# Patient Record
Sex: Male | Born: 1975 | Hispanic: No | Marital: Married | Smoking: Never smoker
Health system: Southern US, Community
[De-identification: ages and names within clinical notes are randomized; demographics above are authoritative.]

---

## 1998-05-19 ENCOUNTER — Ambulatory Visit (HOSPITAL_COMMUNITY): Admission: RE | Admit: 1998-05-19 | Discharge: 1998-05-19 | Payer: Self-pay | Admitting: Orthopedic Surgery

## 1999-05-15 ENCOUNTER — Emergency Department (HOSPITAL_COMMUNITY): Admission: EM | Admit: 1999-05-15 | Discharge: 1999-05-15 | Payer: Self-pay | Admitting: Emergency Medicine

## 1999-12-31 ENCOUNTER — Ambulatory Visit (HOSPITAL_COMMUNITY): Admission: RE | Admit: 1999-12-31 | Discharge: 1999-12-31 | Payer: Self-pay | Admitting: General Surgery

## 2000-09-25 ENCOUNTER — Emergency Department (HOSPITAL_COMMUNITY): Admission: EM | Admit: 2000-09-25 | Discharge: 2000-09-26 | Payer: Self-pay | Admitting: Emergency Medicine

## 2000-09-25 ENCOUNTER — Encounter: Payer: Self-pay | Admitting: Emergency Medicine

## 2000-12-16 ENCOUNTER — Emergency Department (HOSPITAL_COMMUNITY): Admission: EM | Admit: 2000-12-16 | Discharge: 2000-12-16 | Payer: Self-pay | Admitting: Emergency Medicine

## 2000-12-16 ENCOUNTER — Encounter: Payer: Self-pay | Admitting: Emergency Medicine

## 2001-05-24 ENCOUNTER — Encounter: Payer: Self-pay | Admitting: *Deleted

## 2001-05-24 ENCOUNTER — Ambulatory Visit (HOSPITAL_COMMUNITY): Admission: RE | Admit: 2001-05-24 | Discharge: 2001-05-24 | Payer: Self-pay | Admitting: *Deleted

## 2005-01-02 ENCOUNTER — Emergency Department (HOSPITAL_COMMUNITY): Admission: EM | Admit: 2005-01-02 | Discharge: 2005-01-02 | Payer: Self-pay | Admitting: Emergency Medicine

## 2005-02-04 ENCOUNTER — Ambulatory Visit: Payer: Self-pay | Admitting: Internal Medicine

## 2005-10-12 ENCOUNTER — Emergency Department (HOSPITAL_COMMUNITY): Admission: EM | Admit: 2005-10-12 | Discharge: 2005-10-12 | Payer: Self-pay | Admitting: Emergency Medicine
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
## 2020-02-29 ENCOUNTER — Emergency Department (HOSPITAL_COMMUNITY): Payer: Self-pay

## 2020-02-29 ENCOUNTER — Emergency Department (HOSPITAL_COMMUNITY)
Admission: EM | Admit: 2020-02-29 | Discharge: 2020-03-01 | Disposition: A | Discharge status: Home / Self Care | Payer: Self-pay | Attending: Emergency Medicine | Admitting: Emergency Medicine

## 2020-02-29 ENCOUNTER — Encounter (HOSPITAL_COMMUNITY): Payer: Self-pay | Admitting: Emergency Medicine

## 2020-02-29 ENCOUNTER — Other Ambulatory Visit: Payer: Self-pay

## 2020-02-29 DIAGNOSIS — R45851 Suicidal ideations: Secondary | ICD-10-CM | POA: Insufficient documentation

## 2020-02-29 DIAGNOSIS — Z20822 Contact with and (suspected) exposure to covid-19: Secondary | ICD-10-CM | POA: Insufficient documentation

## 2020-02-29 DIAGNOSIS — M79672 Pain in left foot: Secondary | ICD-10-CM | POA: Insufficient documentation

## 2020-02-29 DIAGNOSIS — R443 Hallucinations, unspecified: Secondary | ICD-10-CM

## 2020-02-29 DIAGNOSIS — R441 Visual hallucinations: Secondary | ICD-10-CM | POA: Insufficient documentation

## 2020-02-29 DIAGNOSIS — R440 Auditory hallucinations: Secondary | ICD-10-CM | POA: Insufficient documentation

## 2020-02-29 DIAGNOSIS — F321 Major depressive disorder, single episode, moderate: Secondary | ICD-10-CM | POA: Insufficient documentation

## 2020-02-29 DIAGNOSIS — M79671 Pain in right foot: Secondary | ICD-10-CM | POA: Insufficient documentation

## 2020-02-29 LAB — CBC WITH DIFFERENTIAL/PLATELET
Abs Immature Granulocytes: 0.03 10*3/uL (ref 0.00–0.07)
Basophils Absolute: 0.1 10*3/uL (ref 0.0–0.1)
Basophils Relative: 1 %
Eosinophils Absolute: 0.2 10*3/uL (ref 0.0–0.5)
Eosinophils Relative: 2 %
HCT: 41.6 % (ref 39.0–52.0)
Hemoglobin: 13.8 g/dL (ref 13.0–17.0)
Immature Granulocytes: 0 %
Lymphocytes Relative: 29 %
Lymphs Abs: 3.6 10*3/uL (ref 0.7–4.0)
MCH: 30.7 pg (ref 26.0–34.0)
MCHC: 33.2 g/dL (ref 30.0–36.0)
MCV: 92.4 fL (ref 80.0–100.0)
Monocytes Absolute: 0.9 10*3/uL (ref 0.1–1.0)
Monocytes Relative: 7 %
Neutro Abs: 7.8 10*3/uL — ABNORMAL HIGH (ref 1.7–7.7)
Neutrophils Relative %: 61 %
Platelets: 352 10*3/uL (ref 150–400)
RBC: 4.5 MIL/uL (ref 4.22–5.81)
RDW: 12.2 % (ref 11.5–15.5)
WBC: 12.6 10*3/uL — ABNORMAL HIGH (ref 4.0–10.5)
nRBC: 0 % (ref 0.0–0.2)

## 2020-02-29 LAB — COMPREHENSIVE METABOLIC PANEL
ALT: 18 U/L (ref 0–44)
AST: 41 U/L (ref 15–41)
Albumin: 4.1 g/dL (ref 3.5–5.0)
Alkaline Phosphatase: 59 U/L (ref 38–126)
Anion gap: 9 (ref 5–15)
BUN: 19 mg/dL (ref 6–20)
CO2: 23 mmol/L (ref 22–32)
Calcium: 8.7 mg/dL — ABNORMAL LOW (ref 8.9–10.3)
Chloride: 105 mmol/L (ref 98–111)
Creatinine, Ser: 0.79 mg/dL (ref 0.61–1.24)
GFR, Estimated: >60 mL/min (ref >60)
Glucose, Bld: 107 mg/dL — ABNORMAL HIGH (ref 70–99)
Potassium: 3.8 mmol/L (ref 3.5–5.1)
Sodium: 137 mmol/L (ref 135–145)
Total Bilirubin: 0.7 mg/dL (ref 0.3–1.2)
Total Protein: 6.7 g/dL (ref 6.5–8.1)

## 2020-02-29 LAB — RESP PANEL BY RT-PCR (FLU A&B, COVID) ARPGX2
Influenza A by PCR: NEGATIVE
Influenza B by PCR: NEGATIVE
SARS Coronavirus 2 by RT PCR: NEGATIVE

## 2020-02-29 LAB — ETHANOL: Alcohol, Ethyl (B): <10 mg/dL (ref <10)

## 2020-02-29 MED ORDER — IBUPROFEN 800 MG PO TABS: 800.0000 mg | ORAL_TABLET | Freq: Once | ORAL | Status: DC

## 2020-02-29 NOTE — ED Notes (Signed)
Pt has a knife locked up with security 

## 2020-02-29 NOTE — ED Triage Notes (Signed)
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 States that he is from [place], [place] and came here to work but got fired today from job.

## 2020-02-29 NOTE — ED Provider Notes (Signed)
[facility]   CSN: [id number] Arrival date & time: 02/29/20  2013     History Chief Complaint  Patient presents with  . Suicidal    Francis Lee is a 44 y.o. male presenting to the ED voluntarily for suicidal ideations. Patient states he lives in [place], however is here for work. Per triage note he was fired from his job today. Patient states he has been having anger issues, states people owe him money and they are not paying him. He states he has suicidal ideation that began today with plan to jump from a bridge. He has been kicking things daily for the last few days due to anger and has significant pain to bilateral feet, right worse than left with associated swelling. He also endorses visual hallucinations, states he sees the devil and angels. He also hears auditory hallucinations that are "good" voices. He denies homicidal ideation. His denies any drug or alcohol use. Denies daily medications or medical problems.  The history is provided by the patient.       History reviewed. No pertinent past medical history.  There are no problems to display for this patient.   History reviewed. No pertinent surgical history.     No family history on file.  Social History   Tobacco Use  . Smoking status: Never Smoker  . Smokeless tobacco: Never Used  Substance Use Topics  . Alcohol use: Never  . Drug use: Never    Home Medications Prior to Admission medications   Not on File    Allergies    Patient has no allergy information on record.  Review of Systems   Review of Systems  Musculoskeletal: Positive for arthralgias.  Psychiatric/Behavioral: Positive for hallucinations, self-injury and suicidal ideas.  All other systems reviewed and are negative.   Physical Exam Updated Vital Signs BP 140/73 (BP Location: Right Arm)   Pulse (!) 101   Temp 98 F (36.7 C) (Oral)   Resp 19   SpO2 100%   Physical  Exam Vitals and nursing note reviewed.  Constitutional:      Appearance: He is well-developed. He is not ill-appearing.  HENT:     Head: Normocephalic and atraumatic.  Eyes:     Conjunctiva/sclera: Conjunctivae normal.  Cardiovascular:     Rate and Rhythm: Normal rate.  Pulmonary:     Effort: Pulmonary effort is normal.  Abdominal:     Palpations: Abdomen is soft.  Musculoskeletal:     Comments: No obvious deformity to b/l LE. Generalized TTP to b/l feet and right ankle. No deformity. Mild swelling is noted. Small scattered scabbed wounds without signs of infection to b/l feet. Pt unwilling to range the feet or ankles much 2/t pain. Normal sensation to toes. Intact DP pulses bilaterally.  Skin:    General: Skin is warm.  Neurological:     Mental Status: He is alert.  Psychiatric:        Mood and Affect: Affect normal. Mood is anxious.        Speech: Speech normal.        Behavior: Behavior normal. Behavior is cooperative.        Thought Content: Thought content includes suicidal ideation. Thought content does not include homicidal ideation. Thought content includes suicidal plan.     ED Results / Procedures / Treatments   Labs (all labs ordered are listed, but only abnormal results are displayed) Labs Reviewed  RESP PANEL BY RT-PCR (FLU A&B, COVID)  ARPGX2  COMPREHENSIVE METABOLIC PANEL  ETHANOL  RAPID URINE DRUG SCREEN, HOSP PERFORMED  CBC WITH DIFFERENTIAL/PLATELET    EKG None  Radiology DG Ankle Complete Right  Result Date: 02/29/2020 CLINICAL DATA:  Pain EXAM: RIGHT ANKLE - COMPLETE 3+ VIEW COMPARISON:  None. FINDINGS: There is no evidence of fracture, dislocation, or joint effusion. There is no evidence of arthropathy or other focal bone abnormality. Soft tissues are unremarkable. IMPRESSION: Negative. Electronically Signed   By: Jonna Clark M.D.   On: 02/29/2020 21:53   DG Foot Complete Left  Result Date: 02/29/2020 CLINICAL DATA:  Injury EXAM: LEFT FOOT -  COMPLETE 3+ VIEW COMPARISON:  None. FINDINGS: There is no evidence of fracture or dislocation. There is no evidence of arthropathy or other focal bone abnormality. Soft tissue swelling seen along the dorsum of the foot. IMPRESSION: Negative. Electronically Signed   By: Jonna Clark M.D.   On: 02/29/2020 21:49   DG Foot Complete Right  Result Date: 02/29/2020 CLINICAL DATA:  Pain EXAM: RIGHT FOOT COMPLETE - 3+ VIEW COMPARISON:  None. FINDINGS: There is no evidence of fracture or dislocation. There is no evidence of arthropathy or other focal bone abnormality. Soft tissues are unremarkable. IMPRESSION: Negative. Electronically Signed   By: Jonna Clark M.D.   On: 02/29/2020 21:53    Procedures Procedures (including critical care time)  Medications Ordered in ED Medications  ibuprofen (ADVIL) tablet 800 mg (has no administration in time range)    ED Course  I have reviewed the triage vital signs and the nursing notes.  Pertinent labs & imaging results that were available during my care of the patient were reviewed by me and considered in my medical decision making (see chart for details).    MDM Rules/Calculators/A&P                          Patient presenting to the ED voluntarily for suicidal ideation with plan to jump from a bridge, self-injurious activity with bilateral feet pain, auditory visual hallucinations. Denies drug or alcohol use, denies medical problems or daily medications. On exam, he is cooperative though appears to be somewhat anxious. He endorses pain to bilateral feet, right worse than left, due to kicking items over the last couple of days attributed to anger. On exam, no obvious deformity though does have generalized tenderness to the feet, right ankle is also tender. Neurovascularly intact. Old appearing wounds to the feet without signs of infection. Plain films ordered, ibuprofen for pain.  Screening labs are pending.  xrays independently reviewed and interpreted by  me; xrays are negative for acute fracture.   Care assumed at shift change by PA Upstill to follow blood work and medically clear for TTS evaluation.  Patient presenting voluntarily, however concern for patient's wellbeing with risk to himself. Should patient decide he no longer wants to stay for care, recommend IVC for patient safety.   Final Clinical Impression(s) / ED Diagnoses Final diagnoses:  Suicidal ideations  Hallucination  Bilateral foot pain    Rx / DC Orders ED Discharge Orders    None       Trevonte Ashkar, [place] N, PA-C 02/29/20 2201    Derwood Kaplan, MD 03/01/20 1355

## 2020-03-01 LAB — RAPID URINE DRUG SCREEN, HOSP PERFORMED
Amphetamines: NOT DETECTED
Barbiturates: NOT DETECTED
Benzodiazepines: NOT DETECTED
Cocaine: NOT DETECTED
Opiates: NOT DETECTED
Tetrahydrocannabinol: POSITIVE — AB

## 2020-03-01 NOTE — ED Notes (Signed)
Pt just stated to this NT that his family told him to lie and say that he wanted to commit suicide so that he could get the help he needed.  Pt stated that he lied last night and that he does not want to kill himself, that it never crossed his mind.

## 2020-03-01 NOTE — ED Notes (Signed)
Pt become upset with staff and threw milk in floor. At this time pt is loud and wanting staff to find him a job. Community resources have been attached to his discharge.

## 2020-03-01 NOTE — ED Notes (Signed)
TTS machine at bedside for eval °

## 2020-03-01 NOTE — Progress Notes (Signed)
.. ..  Transition of Care [facility]) - Emergency Department Mini Assessment   Patient Details  Name: Ioane Bhola MRN: [id number] Date of Birth: [birth year]  [facility]) CM/SW Contact:    Elliot Cousin, RN Phone Number: [phone] 03/01/2020, 11:05 AM   Clinical Narrative:  TOC CM spoke to pt and he is currently homeless. Provided pt with Shelter List and bus passes. He moved from [place] and was working but lost his job. Pt states he plans to look for a job.   ED Mini Assessment: What brought you to the Emergency Department? : pain, hopelessness  Barriers to Discharge: No Barriers Identified   Means of departure: Public Transportation  Interventions which prevented an admission or readmission: Transportation Screening, Medication Review, Homeless Screening    Patient Contact and Communications  Admission diagnosis:  SI; voluntary There are no problems to display for this patient.  PCP:  Patient, No Pcp Per Pharmacy:  No Pharmacies Listed

## 2020-03-01 NOTE — ED Notes (Signed)
Pt belongings have been returned to him. Multiple bags including two backpacks and a messenger bag.

## 2020-03-01 NOTE — ED Notes (Signed)
Pt has not voiced any SI to staff this morning since day shift arrival. He is resting in room now that he has completed his TTS.

## 2020-03-01 NOTE — BHH Counselor (Signed)
Disposition:   Ophelia Shoulder, NP, patient psych-cleared

## 2020-03-01 NOTE — BH Assessment (Signed)
Tele Assessment Note   Patient Name: Francis Lee MRN: [id number] Referring Physician: Robinson, [place] N, PA-C Location of Patient: [facility] Location of Provider: [facility]al [facility] TTS Department  Francis Lee is an 44 y.o. male  presenting to the ED voluntarily for suicidal ideations. Patient states he lives in [place], however is here for work. Per triage note he was fired from his job. Patient states he has been having anger issues, states people owe him money and they are not paying him. He states he has suicidal ideation that began today with plan to jump from a bridge. He has been kicking things daily for the last few days due to anger and has significant pain to bilateral feet, right worse than left with associated swelling. He also endorses visual hallucinations, states he sees the devil and angels. He also hears auditory hallucinations that are "good" voices. He denies homicidal ideation. His denies any drug or alcohol use. Denies daily medications or medical problems.  Patient seen by TTS patient denied suicidal/homicidal ideations and denied auditory/visual hallucinations. When asked what changed patient stated, nothing changed, I thought about it I dont want to give up. When asked what happened yesterday to trigger suicidal ideations report, I was fired from my job yesterday. 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 Patient able to contract for safety. He denied past hospitalizations, history of mental health diagnosis or outpatient treatment. Patient irritable during assessment complaining of being hungry and wanting to take a shower.   Disposition: Ophelia Shoulder, NP, patient psych-cleared    Diagnosis:  F32.1  Major depressive disorder, Single episode, Moderate  Past Medical History: History reviewed. No pertinent past medical history.  History reviewed. No pertinent surgical history.  Family History: No family  history on file.  Social History:  reports that he has never smoked. He has never used smokeless tobacco. He reports that he does not drink alcohol and does not use drugs.  Additional Social History:  Alcohol / Drug Use Pain Medications: see MAR Prescriptions: see MAR Over the Counter: see MAR History of alcohol / drug use?: No history of alcohol / drug abuse Longest period of sobriety (when/how long): unknown  CIWA: CIWA-Ar BP: 139/80 Pulse Rate: 77 COWS:    Allergies: Not on File  Home Medications: (Not in a hospital admission)   OB/GYN Status:  No LMP for male patient.  General Assessment Data Location of Assessment: [facility] ED TTS Assessment: In system Is this a Tele or Face-to-Face Assessment?: Tele Assessment Is this an Initial Assessment or a Re-assessment for this encounter?: Initial Assessment Patient Accompanied by::  (alone) Language Other than English: No Living Arrangements: Homeless/Shelter What gender do you identify as?: Male Date Telepsych consult ordered in [facility]: 02/29/20 Time Telepsych consult ordered in [facility]: 0007 Marital status: Separated Maiden name: n/a Pregnancy Status: No Living Arrangements: Other (Comment) (homeless) Can pt return to current living arrangement?: Yes Is patient capable of signing voluntary admission?: Yes Referral Source: Self/Family/Friend Insurance type: self-talk     Crisis Care Plan Living Arrangements: Other (Comment) (homeless) Name of Psychiatrist: denied Name of Therapist: denied   Education Status Is patient currently in school?: No Is the patient employed, unemployed or receiving disability?: Employed  Risk to self with the past 6 months Suicidal Ideation: No-Not Currently/Within Last 6 Months (currently denied, present when entered the ED) Has patient been a risk to self within the past 6 months prior to admission? : No Suicidal  Intent: No Has patient had any suicidal intent within the past 6 months prior to  admission? : No Is patient at risk for suicide?: No Suicidal Plan?: No Has patient had any suicidal plan within the past 6 months prior to admission? : No Access to Means: No What has been your use of drugs/alcohol within the last 12 months?: client denied  Previous Attempts/Gestures: No How many times?: 0 Other Self Harm Risks: client denied Triggers for Past Attempts: None known Intentional Self Injurious Behavior: None (none reported) Family Suicide History: Unknown Recent stressful life event(s): Other (Comment) (fired from job ) Persecutory voices/beliefs?: No Depression: Yes Depression Symptoms: Feeling angry/irritable Substance abuse history and/or treatment for substance abuse?: No Suicide prevention information given to non-admitted patients: Not applicable  Risk to Others within the past 6 months Homicidal Ideation: No Does patient have any lifetime risk of violence toward others beyond the six months prior to admission? : No Thoughts of Harm to Others: No Current Homicidal Intent: No Current Homicidal Plan: No Access to Homicidal Means: No Identified Victim: n/a History of harm to others?: No Assessment of Violence: None Noted Violent Behavior Description: None Noted Does patient have access to weapons?: No Criminal Charges Pending?: No Does patient have a court date: No Is patient on probation?: No  Psychosis Hallucinations: None noted Delusions: None noted  Mental Status Report Appearance/Hygiene: In scrubs Eye Contact: Fair Motor Activity: Freedom of movement Speech: Logical/coherent Level of Consciousness: Alert Mood: Irritable Affect: Irritable Anxiety Level: None Thought Processes: Coherent, Relevant Judgement: Unimpaired Orientation: Person, Place, Time, Situation Obsessive Compulsive Thoughts/Behaviors: None  Cognitive Functioning Concentration: Normal Memory: Recent Intact, Remote Intact Is patient IDD: No Insight: Fair Impulse Control:  Fair Appetite: Good Have you had any weight changes? : No Change Sleep: No Change Total Hours of Sleep:  (unknown ) Vegetative Symptoms: None  ADLScreening [facility] Assessment Services) Patient's cognitive ability adequate to safely complete daily activities?: Yes Patient able to express need for assistance with ADLs?: Yes Independently performs ADLs?: Yes (appropriate for developmental age)  Prior Inpatient Therapy Prior Inpatient Therapy: No (denied)  Prior Outpatient Therapy Prior Outpatient Therapy: No (denied) Does patient have an ACCT team?: No Does patient have Intensive In-House Services?  : No Does patient have [facility] services? : No Does patient have P4CC services?: No  ADL Screening (condition at time of admission) Patient's cognitive ability adequate to safely complete daily activities?: Yes Is the patient deaf or have difficulty hearing?: No Does the patient have difficulty seeing, even when wearing glasses/contacts?: No Does the patient have difficulty concentrating, remembering, or making decisions?: No Patient able to express need for assistance with ADLs?: Yes Does the patient have difficulty dressing or bathing?: No Independently performs ADLs?: Yes (appropriate for developmental age)       Abuse/Neglect Assessment (Assessment to be complete while patient is alone) Abuse/Neglect Assessment Can Be Completed: Yes Physical Abuse: Denies Verbal Abuse: Denies Sexual Abuse: Denies Exploitation of patient/patient's resources: Denies Self-Neglect: Denies     Merchant [employer] officer (For Healthcare) Does Patient Have a Medical Advance Directive?: No Would patient like information on creating a medical advance directive?: No - Patient declined          Disposition:  Disposition Initial Assessment Completed for this Encounter: Yes Ophelia Shoulder, NP, patient psych-cleared with resources)     Dian Situ 03/01/2020 10:07 AM

## 2020-03-01 NOTE — ED Provider Notes (Addendum)
Patient previous seen by Leim Fabry as well as Upstill, PA-C.  Patient medically cleared by previous providers.  Pending TTS evaluation.  Patient not currently under IVC.  Denies any SI, HI, AVH.  He has been resting calmly.  Patient states he would like to go home.  States he was only upset yesterday due to losing his job.  Reiterated to myself as well as to have multiple times that he denies any SI, HI or AVH.  Patient has been psychiatrically cleared.  Was discharged by psychiatry with resources to follow-up outpatient.   Yaritzi Craun A, PA-C 03/01/20 1054    Sharlena Kristensen A, PA-C 03/01/20 1055    Melene Plan, DO 03/01/20 1307

## 2020-03-01 NOTE — ED Notes (Signed)
Pt has not voiced any thoughts of SI today to triage staff. Pt has been resting calmly.

## 2021-11-07 IMAGING — CR DG FOOT COMPLETE 3+V*R*
3 series · 3 of 3 positions shown · non-contrast
Comparison: None.

CLINICAL DATA: Pain

EXAM:
RIGHT FOOT COMPLETE - 3+ VIEW

[x foot lat right]
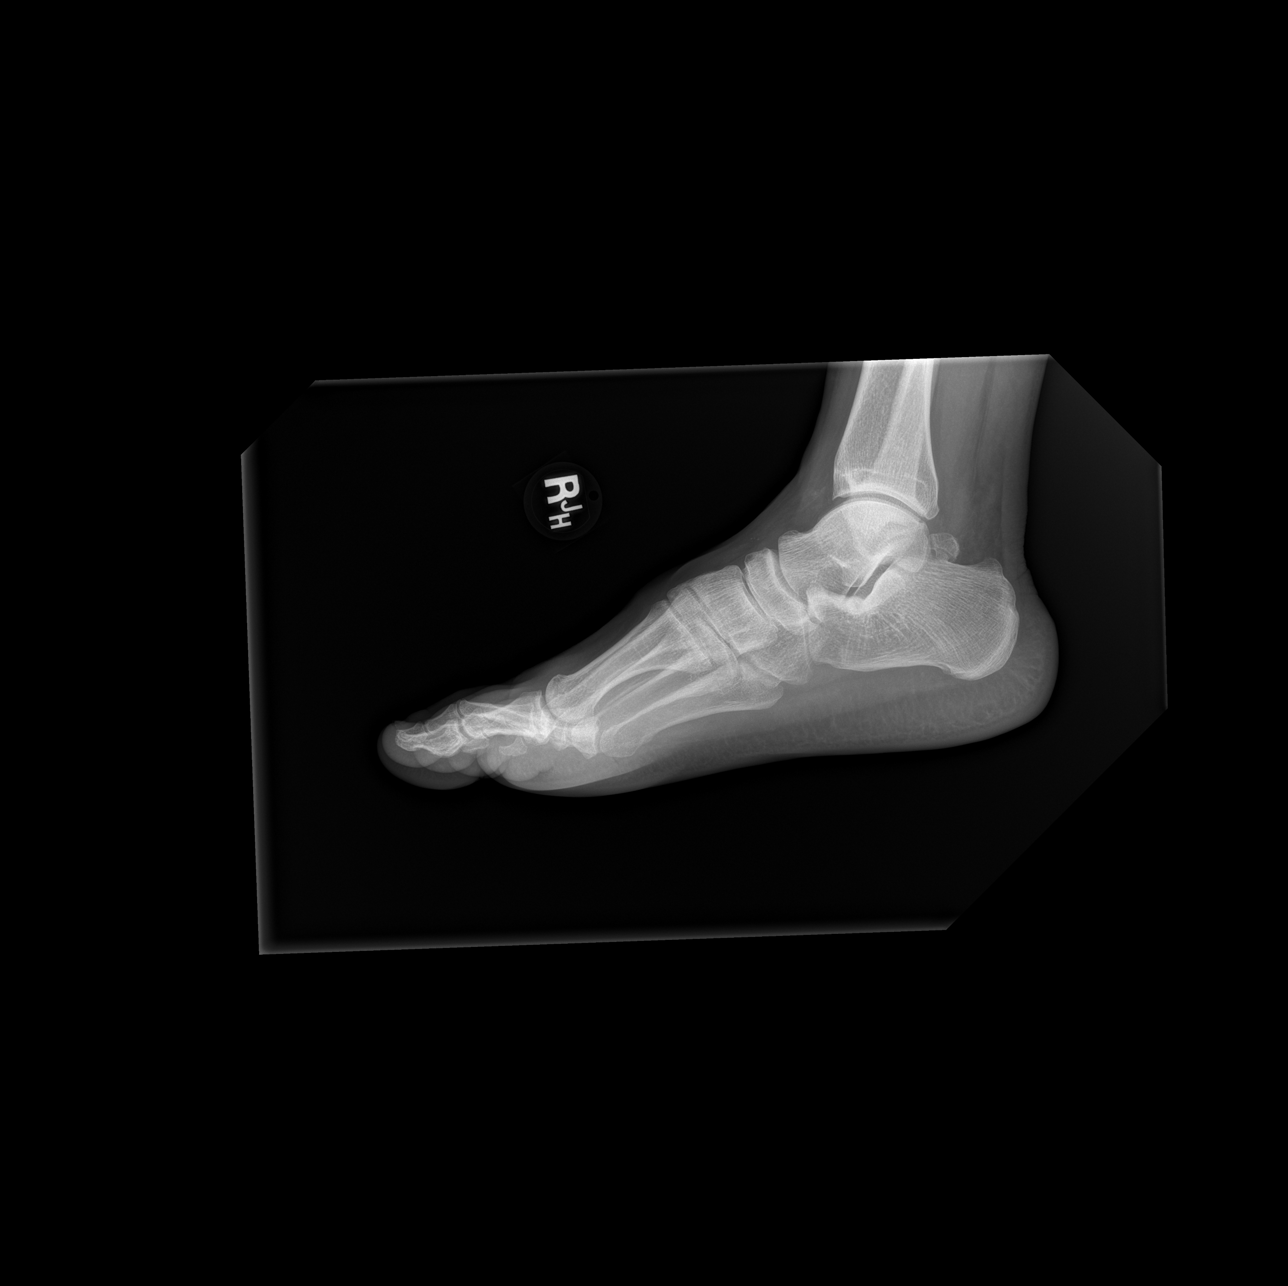

[x foot ap right]
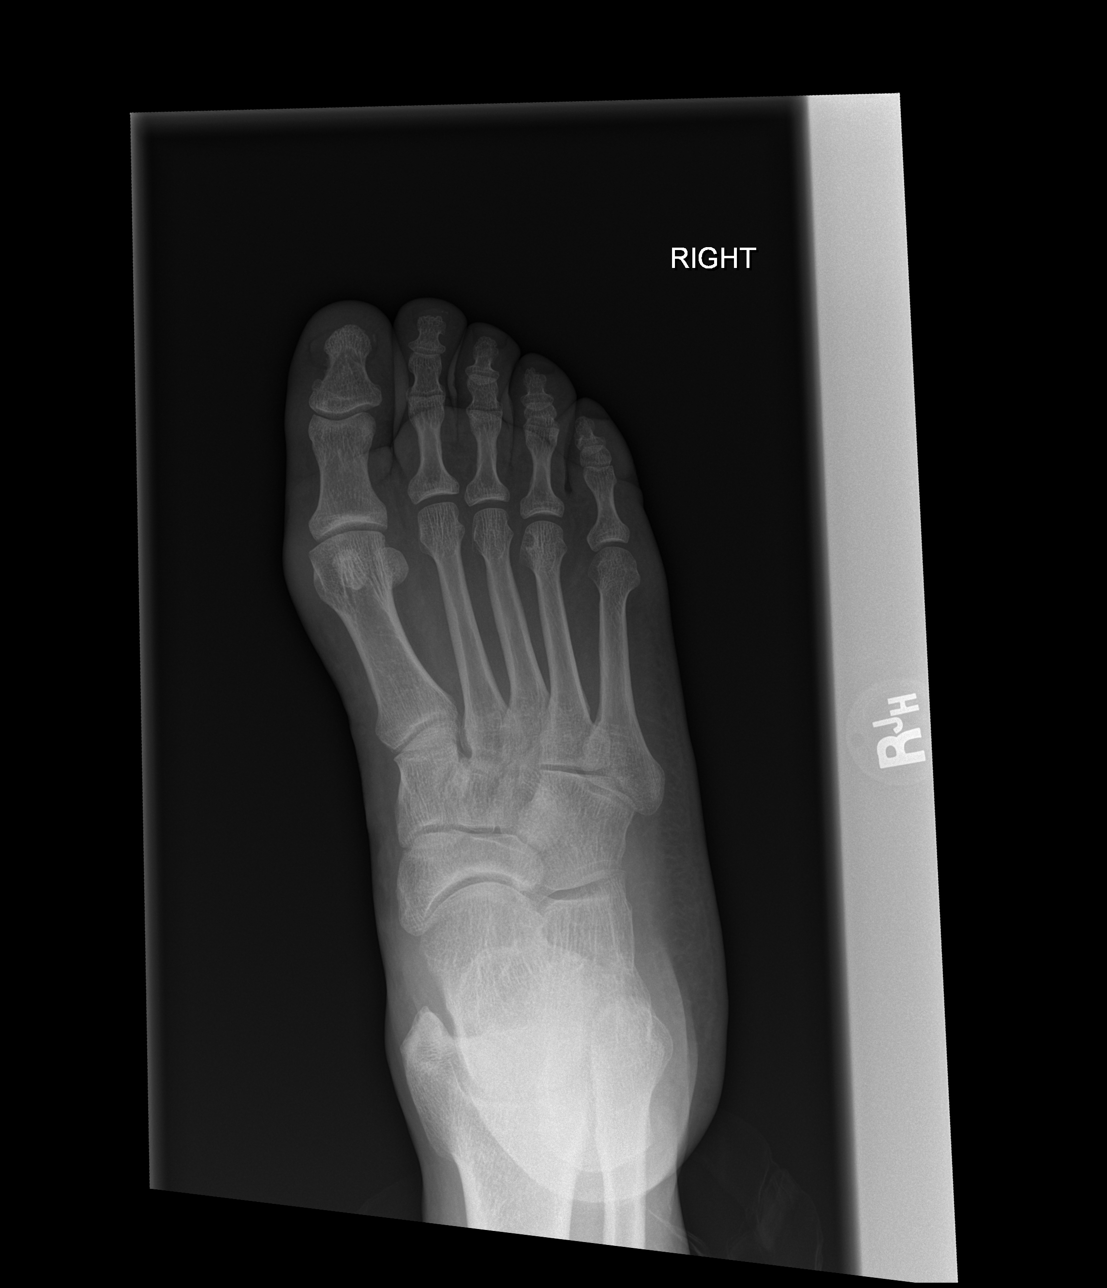

[x foot obl right]
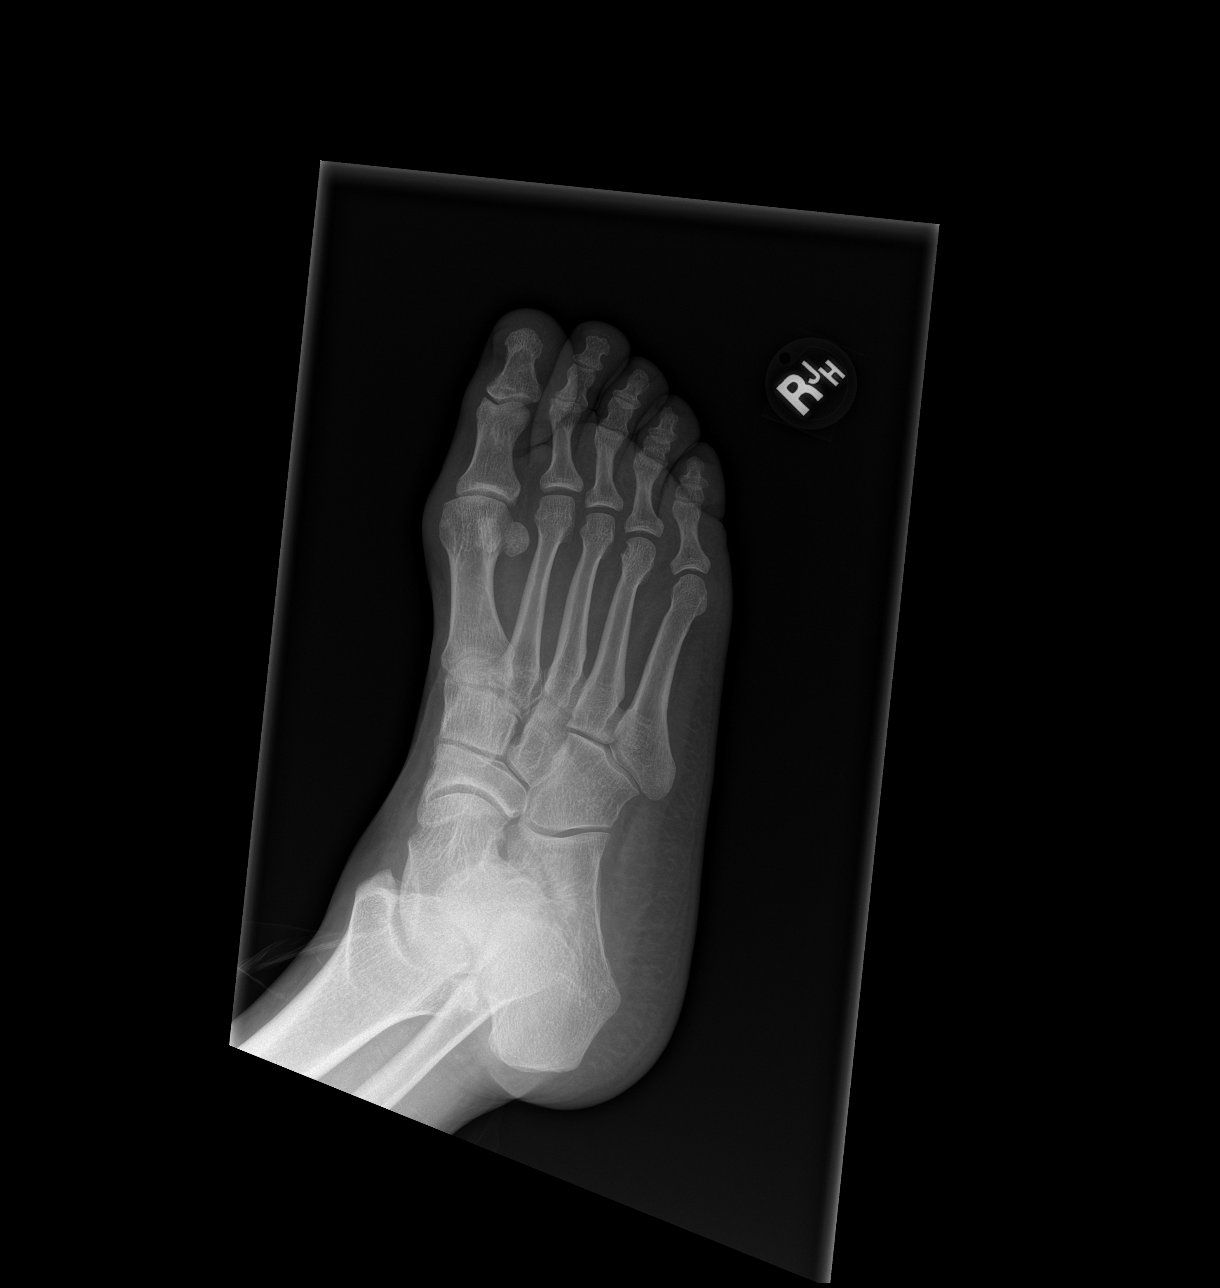

[3 of 3 positions shown; findings below may reference images not displayed]

FINDINGS: There is no evidence of fracture or dislocation. There is no
evidence of arthropathy or other focal bone abnormality. Soft
tissues are unremarkable.
IMPRESSION: Negative.
